# Patient Record
Sex: Female | Born: 1990 | Hispanic: No | Smoking: Current every day smoker
Health system: Southern US, Community
[De-identification: ages and names within clinical notes are randomized; demographics above are authoritative.]

## PROBLEM LIST (undated history)

## (undated) ENCOUNTER — Inpatient Hospital Stay (HOSPITAL_COMMUNITY): Payer: Self-pay

## (undated) DIAGNOSIS — J45909 Unspecified asthma, uncomplicated: Secondary | ICD-10-CM

## (undated) DIAGNOSIS — F419 Anxiety disorder, unspecified: Secondary | ICD-10-CM

## (undated) HISTORY — PX: NO PAST SURGERIES: SHX2092

---

## 2013-07-21 ENCOUNTER — Encounter (HOSPITAL_COMMUNITY): Payer: Self-pay | Admitting: Emergency Medicine

## 2013-07-21 ENCOUNTER — Emergency Department (HOSPITAL_COMMUNITY)
Admission: EM | Admit: 2013-07-21 | Discharge: 2013-07-21 | Disposition: A | Discharge status: Home / Self Care | Payer: Medicaid Other | Attending: Emergency Medicine | Admitting: Emergency Medicine

## 2013-07-21 DIAGNOSIS — N6459 Other signs and symptoms in breast: Secondary | ICD-10-CM | POA: Insufficient documentation

## 2013-07-21 DIAGNOSIS — N644 Mastodynia: Secondary | ICD-10-CM

## 2013-07-21 DIAGNOSIS — IMO0001 Reserved for inherently not codable concepts without codable children: Secondary | ICD-10-CM | POA: Insufficient documentation

## 2013-07-21 DIAGNOSIS — Z79899 Other long term (current) drug therapy: Secondary | ICD-10-CM | POA: Insufficient documentation

## 2013-07-21 DIAGNOSIS — F17200 Nicotine dependence, unspecified, uncomplicated: Secondary | ICD-10-CM | POA: Insufficient documentation

## 2013-07-21 DIAGNOSIS — Z3202 Encounter for pregnancy test, result negative: Secondary | ICD-10-CM | POA: Insufficient documentation

## 2013-07-21 LAB — POCT PREGNANCY, URINE: Preg Test, Ur: NEGATIVE

## 2013-07-21 NOTE — ED Notes (Signed)
Pt sts lump on right breast and tenderness; pt sts LMP was 8/17; pt sts could be pregnant

## 2013-07-21 NOTE — ED Provider Notes (Signed)
CSN: [id number]     Arrival date & time 07/21/13  1040 History  This chart was scribed for Antony Madura, PA, working with Toy Baker, MD by Blanchard Kelch, ED Scribe. This patient was seen in room TR08C/TR08C and the patient's care was started at 11:28 AM.    Chief Complaint  Patient presents with  . Breast Pain    The history is provided by the patient. No language interpreter was used.    HPI Comments: Leah Blair is a 22 y.o. female who presents to the Emergency Department complaining of constant pain in her b/l breasts and a lump on her right breast that began five days ago. She describes the pain as aching and worsened with touch. She denies redness and heat-to-touch around the breast lump as well as discharge from her nipples. She denies fever, diaphoresis, numbness or tingling in upper extremities, pain under arms and shortness of breath. She denies having an OB/GYN. She denies knowing any history of breast cancer in her family. Her last menstrual period was 8/17. She just got off the depo shot and states her menstrual period is currently irregular.    History reviewed. No pertinent past medical history. History reviewed. No pertinent past surgical history. History reviewed. No pertinent family history. History  Substance Use Topics  . Smoking status: Current Every Day Smoker  . Smokeless tobacco: Not on file  . Alcohol Use: Yes   OB History   Grav Para Term Preterm Abortions TAB SAB Ect Mult Living                 Review of Systems  Constitutional: Negative for fever and diaphoresis.  Respiratory: Negative for shortness of breath.   Musculoskeletal: Positive for myalgias.  Neurological: Negative for numbness.  All other systems reviewed and are negative.   Allergies  Review of patient's allergies indicates no known allergies.  Home Medications   Current Outpatient Rx  Name  Route  Sig  Dispense  Refill  . Cholecalciferol (VITAMIN D PO)   Oral   Take 1  tablet by mouth daily.         . Prenatal Vit-Fe Fumarate-FA (PRENATAL MULTIVITAMIN) TABS tablet   Oral   Take 1 tablet by mouth daily at 12 noon.         Marland Kitchen PRESCRIPTION MEDICATION   Oral   Take 1 tablet by mouth daily.          Triage Vitals: BP 120/77  Pulse 110  Temp(Src) 97.8 F (36.6 C) (Oral)  Resp 20  SpO2 100%  Physical Exam  Nursing note and vitals reviewed. Constitutional: She is oriented to person, place, and time. She appears well-developed and well-nourished. No distress.  HENT:  Head: Normocephalic and atraumatic.  Eyes: Conjunctivae and EOM are normal. No scleral icterus.  Neck: Normal range of motion.  Cardiovascular: Normal rate, regular rhythm and normal heart sounds.   Pulmonary/Chest: Effort normal. No respiratory distress. She has no wheezes. She has no rales. Right breast exhibits tenderness. Right breast exhibits no inverted nipple, no nipple discharge and no skin change. Left breast exhibits tenderness. Left breast exhibits no inverted nipple, no nipple discharge and no skin change.    Grouped, movable, tender lumps appreciated on exam of R breast at 3 o'clock position. No nodules appreciated.  Abdominal: Soft. She exhibits no distension and no mass. There is no tenderness. There is no rebound and no guarding.  Musculoskeletal: Normal range of motion.  Neurological: She  is alert and oriented to person, place, and time.  Skin: Skin is warm and dry. No rash noted. She is not diaphoretic. No erythema. No pallor.  Psychiatric: She has a normal mood and affect. Her behavior is normal.    ED Course  Procedures (including critical care time)  DIAGNOSTIC STUDIES:  Oxygen Saturation is 100% on room air, normal by my interpretation.    COORDINATION OF CARE:  11:36 AM -Recommend follow up with breast clinic and women's outpatient clinic for an OB/GYN. Patient verbalizes understanding and agrees with treatment plan.   Labs Review Labs Reviewed   POCT PREGNANCY, URINE   Imaging Review No results found.  MDM   1. Breast tenderness in female    22 year old otherwise healthy female who presents for tenderness to her bilateral breasts and lump to her right breast. Patient well and nontoxic appearing, hemodynamically stable, and afebrile. Physical exam findings as above. No physical exam findings concerning for breast abscess. No firm nodular, immobile mass appreciated. Urine pregnancy negative. Symptoms most consistent with fibrocystic breast change. Will refer to [facility] for further evaluation of symptoms. Ibuprofen recommended for pain control. Patient agreeable to plan with no unaddressed concerns.  I personally performed the services described in this documentation, which was scribed in my presence. The recorded information has been reviewed and is accurate.    Antony Madura, PA-C 07/21/13 1610  [address] PA-C 07/21/13 1610

## 2013-07-24 NOTE — ED Provider Notes (Signed)
Medical screening examination/treatment/procedure(s) were performed by non-physician practitioner and as supervising physician I was immediately available for consultation/collaboration.  Marykay Mccleod T Danell Verno, MD 07/24/13 1755 

## 2013-12-13 ENCOUNTER — Encounter (HOSPITAL_COMMUNITY): Payer: Self-pay | Admitting: Family

## 2013-12-13 ENCOUNTER — Inpatient Hospital Stay (HOSPITAL_COMMUNITY)
Admission: AD | Admit: 2013-12-13 | Discharge: 2013-12-13 | Disposition: A | Discharge status: Home / Self Care | Payer: Medicaid Other | Source: Ambulatory Visit | Attending: Obstetrics and Gynecology | Admitting: Obstetrics and Gynecology

## 2013-12-13 DIAGNOSIS — F17200 Nicotine dependence, unspecified, uncomplicated: Secondary | ICD-10-CM | POA: Insufficient documentation

## 2013-12-13 DIAGNOSIS — O039 Complete or unspecified spontaneous abortion without complication: Secondary | ICD-10-CM | POA: Insufficient documentation

## 2013-12-13 DIAGNOSIS — J45909 Unspecified asthma, uncomplicated: Secondary | ICD-10-CM | POA: Insufficient documentation

## 2013-12-13 HISTORY — DX: Unspecified asthma, uncomplicated: J45.909

## 2013-12-13 HISTORY — DX: Anxiety disorder, unspecified: F41.9

## 2013-12-13 LAB — CBC
HCT: 40.4 % (ref 36.0–46.0)
Hemoglobin: 14 g/dL (ref 12.0–15.0)
MCH: 29.6 pg (ref 26.0–34.0)
MCHC: 34.7 g/dL (ref 30.0–36.0)
MCV: 85.4 fL (ref 78.0–100.0)
Platelets: 330 10*3/uL (ref 150–400)
RBC: 4.73 MIL/uL (ref 3.87–5.11)
RDW: 14.4 % (ref 11.5–15.5)
WBC: 5.4 10*3/uL (ref 4.0–10.5)

## 2013-12-13 LAB — URINALYSIS, ROUTINE W REFLEX MICROSCOPIC
Bilirubin Urine: NEGATIVE
GLUCOSE, UA: NEGATIVE mg/dL
Ketones, ur: NEGATIVE mg/dL
LEUKOCYTES UA: NEGATIVE
Nitrite: NEGATIVE
PH: 7 (ref 5.0–8.0)
Protein, ur: NEGATIVE mg/dL
Specific Gravity, Urine: 1.01 (ref 1.005–1.030)
Urobilinogen, UA: 0.2 mg/dL (ref 0.0–1.0)

## 2013-12-13 LAB — HCG, QUANTITATIVE, PREGNANCY: hCG, Beta Chain, Quant, S: 18 m[IU]/mL — ABNORMAL HIGH (ref <5)

## 2013-12-13 LAB — URINE MICROSCOPIC-ADD ON: RBC / HPF: NONE SEEN RBC/hpf (ref <3)

## 2013-12-13 LAB — ABO/RH: ABO/RH(D): O POS

## 2013-12-13 LAB — POCT PREGNANCY, URINE: PREG TEST UR: NEGATIVE

## 2013-12-13 NOTE — MAU Provider Note (Signed)
Attestation of Attending Supervision of Advanced Practitioner (CNM/NP): Evaluation and management procedures were performed by the Advanced Practitioner under my supervision and collaboration.  I have reviewed the Advanced Practitioner's note and chart, and I agree with the management and plan.  Zoiee Wimmer 12/13/2013 3:03 PM

## 2013-12-13 NOTE — Discharge Instructions (Signed)
Miscarriage  A miscarriage is the loss of an unborn baby (fetus) before the 20th week of pregnancy. The cause is often unknown.   HOME CARE  · You may need to stay in bed (bed rest), or you may be able to do light activity. Go about activity as told by your doctor.  · Have help at home.  · Write down how many pads you use each day. Write down how soaked they are.  · Do not use tampons. Do not wash out your vagina (douche) or have sex (intercourse) until your doctor approves.  · Only take medicine as told by your doctor.  · Do not take aspirin.  · Keep all doctor visits as told.  · If you or your partner have problems with grieving, talk to your doctor. You can also try counseling. Give yourself time to grieve before trying to get pregnant again.  GET HELP RIGHT AWAY IF:  · You have bad cramps or pain in your back or belly (abdomen).  · You have a fever.  · You pass large clumps of blood (clots) from your vagina that are walnut-sized or larger. Save the clumps for your doctor to see.  · You pass large amounts of tissue from your vagina. Save the tissue for your doctor to see.  · You have more bleeding.  · You have thick, bad-smelling fluid (discharge) coming from the vagina.  · You get lightheaded, weak, or you pass out (faint).  · You have chills.  MAKE SURE YOU:  · Understand these instructions.  · Will watch your condition.  · Will get help right away if you are not doing well or get worse.  Document Released: 01/07/2012 Document Reviewed: 01/07/2012  ExitCare® Patient Information ©2014 [employer].

## 2013-12-13 NOTE — MAU Note (Signed)
Pt presents with complaints of bright red vaginal bleeding that started on February the 11th. She states that she had a + pregnancy test at her physicians office on January the 29th

## 2013-12-13 NOTE — MAU Provider Note (Signed)
History     CSN: [id number]  Arrival date and time: 12/13/13 1191   First Provider Initiated Contact with Patient 12/13/13 1000      Chief Complaint  Patient presents with  . Vaginal Bleeding   HPI 23 y.o. [id number] at Unknown with vaginal bleeding and low back pain since 12/09/13. States bleeding was light-moderate, is now subsiding, pain is improved. Patient's last menstrual period was 10/30/2013. + Pregnancy test at PCP's office on 11/26/13. States PCP has been following quants, first was 33, second was 203, third dropped to 55. Pt states they told her "they don't know what's going on". Pt states she had an u/s and "they couldn't see anything".  Past Medical History  Diagnosis Date  . Anxiety   . Asthma     History reviewed. No pertinent past surgical history.  History reviewed. No pertinent family history.  History  Substance Use Topics  . Smoking status: Current Every Day Smoker -- 0.25 packs/day  . Smokeless tobacco: Not on file  . Alcohol Use: Yes    Allergies: No Known Allergies  Prescriptions prior to admission  Medication Sig Dispense Refill  . Cholecalciferol (VITAMIN D PO) Take 1 tablet by mouth daily.      . Prenatal Vit-Fe Fumarate-FA (PRENATAL MULTIVITAMIN) TABS tablet Take 1 tablet by mouth daily at 12 noon.      Marland Kitchen PRESCRIPTION MEDICATION Take 1 tablet by mouth daily.        Review of Systems  Constitutional: Negative.   Respiratory: Negative.   Cardiovascular: Negative.   Gastrointestinal: Negative for nausea, vomiting, abdominal pain, diarrhea and constipation.  Genitourinary: Negative for dysuria, urgency, frequency, hematuria and flank pain.       + bleeding   Musculoskeletal: Positive for back pain.  Neurological: Negative.   Psychiatric/Behavioral: Negative.    Physical Exam   Blood pressure 131/78, pulse 107, temperature 98.2 F (36.8 C), resp. rate 18, height 5\' 1"  (1.549 m), weight 134 lb (60.782 kg), last menstrual period  10/30/2013.  Physical Exam  Nursing note and vitals reviewed. Constitutional: She is oriented to person, place, and time. She appears well-developed and well-nourished. No distress.  Cardiovascular: Normal rate.   Respiratory: Effort normal.  GI: Soft. There is no tenderness.  Musculoskeletal: Normal range of motion.  Neurological: She is alert and oriented to person, place, and time.  Skin: Skin is warm and dry.  Psychiatric: She has a normal mood and affect.    MAU Course  Procedures  Results for orders placed during the hospital encounter of 12/13/13 (from the past 24 hour(s))  URINALYSIS, ROUTINE W REFLEX MICROSCOPIC     Status: Abnormal   Collection Time    12/13/13  9:57 AM      Result Value Ref Range   Color, Urine YELLOW  YELLOW   APPearance CLEAR  CLEAR   Specific Gravity, Urine 1.010  1.005 - 1.030   pH 7.0  5.0 - 8.0   Glucose, UA NEGATIVE  NEGATIVE mg/dL   Hgb urine dipstick MODERATE (*) NEGATIVE   Bilirubin Urine NEGATIVE  NEGATIVE   Ketones, ur NEGATIVE  NEGATIVE mg/dL   Protein, ur NEGATIVE  NEGATIVE mg/dL   Urobilinogen, UA 0.2  0.0 - 1.0 mg/dL   Nitrite NEGATIVE  NEGATIVE   Leukocytes, UA NEGATIVE  NEGATIVE  POCT PREGNANCY, URINE     Status: None   Collection Time    12/13/13  9:57 AM      Result Value Ref Range  Preg Test, Ur NEGATIVE  NEGATIVE  URINE MICROSCOPIC-ADD ON     Status: Abnormal   Collection Time    12/13/13  9:57 AM      Result Value Ref Range   Squamous Epithelial / LPF FEW (*) RARE   RBC / HPF    <3 RBC/hpf   Value: NO FORMED ELEMENTS SEEN ON URINE MICROSCOPIC EXAMINATION   Urine-Other MUCOUS PRESENT    HCG, QUANTITATIVE, PREGNANCY     Status: Abnormal   Collection Time    12/13/13 10:10 AM      Result Value Ref Range   hCG, Beta Chain, Quant, S 18 (*) <5 mIU/mL  ABO/RH     Status: None   Collection Time    12/13/13 10:10 AM      Result Value Ref Range   ABO/RH(D) O POS    CBC     Status: None   Collection Time    12/13/13  10:10 AM      Result Value Ref Range   WBC 5.4  4.0 - 10.5 K/uL   RBC 4.73  3.87 - 5.11 MIL/uL   Hemoglobin 14.0  12.0 - 15.0 g/dL   HCT 16.140.4  09.636.0 - 04.546.0 %   MCV 85.4  78.0 - 100.0 fL   MCH 29.6  26.0 - 34.0 pg   MCHC 34.7  30.0 - 36.0 g/dL   RDW 40.914.4  81.111.5 - 91.415.5 %   Platelets 330  150 - 400 K/uL     Assessment and Plan   1. SAB (spontaneous abortion)   Declining HCG levels c/w SAB, bleeding is decreasing, hgb WNL. Pt[place], pt states she does not plan to return there for f/u. [facility], message sent to schedule appointment in 2 weeks. Bleeding and pain precautions rev'd.     Medication List         albuterol 108 (90 BASE) MCG/ACT inhaler  Commonly known as:  PROVENTIL HFA;VENTOLIN HFA  Inhale 2 puffs into the lungs every 6 (six) hours as needed for wheezing or shortness of breath.     FOLIC ACID PO  Take 1 tablet by mouth daily.     prenatal multivitamin Tabs tablet  Take 1 tablet by mouth daily at 12 noon.            Follow-up Information   [facility]. Schedule an appointment as soon as possible for a visit in 2 weeks.   Specialty:  Obstetrics and Gynecology   Contact information:   [address] [place] [id number] [phone]        [facility],Malerie Eakins 12/13/2013, 10:06 AM

## 2013-12-13 NOTE — MAU Note (Addendum)
[age] yo, LMP 10/30/13 with +HPT, presents to MAU with c/o vaginal bleeding since 12/09/13. Reports lower back pain; reports bleeding is subsiding at this time.

## 2013-12-17 ENCOUNTER — Inpatient Hospital Stay (HOSPITAL_COMMUNITY): Payer: Medicaid Other

## 2013-12-17 ENCOUNTER — Inpatient Hospital Stay (HOSPITAL_COMMUNITY)
Admission: AD | Admit: 2013-12-17 | Discharge: 2013-12-17 | Disposition: A | Discharge status: Home / Self Care | Payer: Medicaid Other | Source: Ambulatory Visit | Attending: Obstetrics & Gynecology | Admitting: Obstetrics & Gynecology

## 2013-12-17 ENCOUNTER — Encounter (HOSPITAL_COMMUNITY): Payer: Self-pay | Admitting: *Deleted

## 2013-12-17 DIAGNOSIS — O200 Threatened abortion: Secondary | ICD-10-CM

## 2013-12-17 DIAGNOSIS — F17200 Nicotine dependence, unspecified, uncomplicated: Secondary | ICD-10-CM | POA: Insufficient documentation

## 2013-12-17 DIAGNOSIS — O039 Complete or unspecified spontaneous abortion without complication: Secondary | ICD-10-CM | POA: Insufficient documentation

## 2013-12-17 LAB — CBC
HEMATOCRIT: 38.1 % (ref 36.0–46.0)
Hemoglobin: 13.6 g/dL (ref 12.0–15.0)
MCH: 30.2 pg (ref 26.0–34.0)
MCHC: 35.7 g/dL (ref 30.0–36.0)
MCV: 84.7 fL (ref 78.0–100.0)
Platelets: 291 10*3/uL (ref 150–400)
RBC: 4.5 MIL/uL (ref 3.87–5.11)
RDW: 14.3 % (ref 11.5–15.5)
WBC: 9.3 10*3/uL (ref 4.0–10.5)

## 2013-12-17 LAB — HCG, QUANTITATIVE, PREGNANCY: HCG, BETA CHAIN, QUANT, S: 4 m[IU]/mL (ref <5)

## 2013-12-17 MED ORDER — ACETAMINOPHEN 500 MG PO TABS
1000.0000 mg | ORAL_TABLET | Freq: Once | ORAL | Status: AC
  Administered 2013-12-17: 1000 mg via ORAL
  Filled 2013-12-17: qty 2

## 2013-12-17 NOTE — MAU Provider Note (Signed)
Attestation of Attending Supervision of Advanced Practitioner (PA/CNM/NP): Evaluation and management procedures were performed by the Advanced Practitioner under my supervision and collaboration.  I have reviewed the Advanced Practitioner's note and chart, and I agree with the management and plan.  Melquan Ernsberger, MD, FACOG Attending Obstetrician & Gynecologist Faculty Practice, [facility]  

## 2013-12-17 NOTE — MAU Provider Note (Signed)
History     CSN: [id number]  Arrival date and time: 12/17/13 1141   First Provider Initiated Contact with Patient 12/17/13 1153      Chief Complaint  Patient presents with  . Back Pain   HPIpt is [redacted]w[redacted]d pregnant who returns for repeat HCG after being seen on 12/13/2013 with dropping quants c/w SAB- previously evaluated By doctor in [facility]- (she does not plan to return there for care) She also had an ultrasound that didn't who anything in the uterus. Pt's HCG on 12/13/2013 was 18 and today it is 4.   RN note: Rennis Harding, RN Registered Nurse Signed  MAU Note Service date: 12/17/2013 11:45 AM   Pt Here on Sunday with dx of threatened miscarriage had some spotting an pian on .Sunday. F/U in clinic next month. Started having increased pain in her back that started last night. No bleeding just pain today     Past Medical History  Diagnosis Date  . Anxiety   . Asthma     History reviewed. No pertinent past surgical history.  No family history on file.  History  Substance Use Topics  . Smoking status: Current Every Day Smoker -- 0.25 packs/day  . Smokeless tobacco: Not on file  . Alcohol Use: Yes    Allergies: No Known Allergies  Prescriptions prior to admission  Medication Sig Dispense Refill  . albuterol (PROVENTIL HFA;VENTOLIN HFA) 108 (90 BASE) MCG/ACT inhaler Inhale 2 puffs into the lungs every 6 (six) hours as needed for wheezing or shortness of breath.      . Prenatal Vit-Fe Fumarate-FA (PRENATAL MULTIVITAMIN) TABS tablet Take 1 tablet by mouth daily at 12 noon.        Review of Systems  Constitutional: Negative for fever and chills.  Gastrointestinal: Positive for abdominal pain. Negative for nausea and vomiting.  Genitourinary: Negative for dysuria and urgency.  Musculoskeletal: Positive for back pain.  Neurological: Positive for headaches.   Physical Exam   Blood pressure 117/62, pulse 125, temperature 99.9 F (37.7 C), temperature source  Oral, resp. rate 18, last menstrual period 10/30/2013.  Physical Exam  Nursing note and vitals reviewed. Constitutional: She is oriented to person, place, and time. She appears well-developed and well-nourished. No distress.  HENT:  Head: Normocephalic.  Eyes: Pupils are equal, round, and reactive to light.  Neck: Normal range of motion. Neck supple.  Cardiovascular: Normal rate.   Respiratory: Effort normal.  GI: Soft. She exhibits no distension. There is no tenderness.  Musculoskeletal: Normal range of motion.  Neurological: She is alert and oriented to person, place, and time.  Skin: Skin is warm and dry.  Psychiatric: She has a normal mood and affect.    MAU Course  Procedures Results for orders placed during the hospital encounter of 12/17/13 (from the past 24 hour(s))  HCG, QUANTITATIVE, PREGNANCY     Status: None   Collection Time    12/17/13 12:05 PM      Result Value Ref Range   hCG, Beta Chain, Quant, S 4  <5 mIU/mL  CBC     Status: None   Collection Time    12/17/13 12:07 PM      Result Value Ref Range   WBC 9.3  4.0 - 10.5 K/uL   RBC 4.50  3.87 - 5.11 MIL/uL   Hemoglobin 13.6  12.0 - 15.0 g/dL   HCT 40.9  81.1 - 91.4 %   MCV 84.7  78.0 - 100.0 fL   MCH  30.2  26.0 - 34.0 pg   MCHC 35.7  30.0 - 36.0 g/dL   RDW 62.314.3  76.211.5 - 83.115.5 %   Platelets 291  150 - 400 K/uL   [place] Ob Comp Less 14 Wks  12/17/2013   CLINICAL DATA:  Pregnant, bleeding, 6 weeks 6 days EGA by LMP, spontaneous abortion, question retained products of conception, back pain  EXAM: OBSTETRIC <14 WK US AND TRANSVAGINAL OB US  TECHNIQUE: Both transabdominal and transvaginal ultrasound examinations were performed for complete evaluation of the gestation as well as the maternal uterus, adnexal regions, and pelvic cul-de-sac. Transvaginal technique was performed to assess early pregnancy.  COMPARISON:  None  FINDINGS: Intrauterine gestational sac: Not identified  Yolk sac:  N/A  Embryo:  N/A  Cardiac Activity:  N/A  Heart Rate:  N/A bpm  Maternal uterus/adnexae:  Mild anteflexion of the uterus.  No focal uterine mass or fluid collection.  Endometrial complex 3 mm thick without fluid or focal abnormality; no retained products of conception identified.  Left ovary normal size and morphology, 4.5 x 2.0 x 2.3 cm.  Right ovary normal size and morphology, 3.7 x 2.3 x 2.2 cm.  No adnexal masses.  Trace free pelvic fluid.  IMPRESSION: Trace free pelvic fluid.  Otherwise normal pelvic sonography.  No intrauterine pregnancy or retained products of conception identified.   Electronically Signed   By: Ulyses SouthwardMark  Boles M.D.   On: 12/17/2013 12:52   [place] Ob Transvaginal  12/17/2013   CLINICAL DATA:  Pregnant, bleeding, 6 weeks 6 days EGA by LMP, spontaneous abortion, question retained products of conception, back pain  EXAM: OBSTETRIC <14 WK US AND TRANSVAGINAL OB US  TECHNIQUE: Both transabdominal and transvaginal ultrasound examinations were performed for complete evaluation of the gestation as well as the maternal uterus, adnexal regions, and pelvic cul-de-sac. Transvaginal technique was performed to assess early pregnancy.  COMPARISON:  None  FINDINGS: Intrauterine gestational sac: Not identified  Yolk sac:  N/A  Embryo:  N/A  Cardiac Activity: N/A  Heart Rate:  N/A bpm  Maternal uterus/adnexae:  Mild anteflexion of the uterus.  No focal uterine mass or fluid collection.  Endometrial complex 3 mm thick without fluid or focal abnormality; no retained products of conception identified.  Left ovary normal size and morphology, 4.5 x 2.0 x 2.3 cm.  Right ovary normal size and morphology, 3.7 x 2.3 x 2.2 cm.  No adnexal masses.  Trace free pelvic fluid.  IMPRESSION: Trace free pelvic fluid.  Otherwise normal pelvic sonography.  No intrauterine pregnancy or retained products of conception identified.   Electronically Signed   By: Ulyses SouthwardMark  Boles M.D.   On: 12/17/2013 12:52   Assessment and Plan  SAB- f/u in clinic for post SAB  appt  Leah Blair 12/17/2013, 1:08 PM

## 2013-12-17 NOTE — MAU Note (Signed)
Pt Here on Sunday with dx of threatened miscarriage had some spotting an pian on .Sunday. F/U in clinic next month. Started having increased pain in her back that started last night. No bleeding just pain today.

## 2013-12-17 NOTE — Discharge Instructions (Signed)
Miscarriage A miscarriage is the sudden loss of an unborn baby (fetus) before the 20th week of pregnancy. Most miscarriages happen in the first 3 months of pregnancy. Sometimes, it happens before a woman even knows she is pregnant. A miscarriage is also called a "spontaneous miscarriage" or "early pregnancy loss." Having a miscarriage can be an emotional experience. Talk with your caregiver about any questions you may have about miscarrying, the grieving process, and your future pregnancy plans. CAUSES   Problems with the fetal chromosomes that make it impossible for the baby to develop normally. Problems with the baby's genes or chromosomes are most often the result of errors that occur, by chance, as the embryo divides and grows. The problems are not inherited from the parents.  Infection of the cervix or uterus.   Hormone problems.   Problems with the cervix, such as having an incompetent cervix. This is when the tissue in the cervix is not strong enough to hold the pregnancy.   Problems with the uterus, such as an abnormally shaped uterus, uterine fibroids, or congenital abnormalities.   Certain medical conditions.   Smoking, drinking alcohol, or taking illegal drugs.   Trauma.  Often, the cause of a miscarriage is unknown.  SYMPTOMS   Vaginal bleeding or spotting, with or without cramps or pain.  Pain or cramping in the abdomen or lower back.  Passing fluid, tissue, or blood clots from the vagina. DIAGNOSIS  Your caregiver will perform a physical exam. You may also have an ultrasound to confirm the miscarriage. Blood or urine tests may also be ordered. TREATMENT   Sometimes, treatment is not necessary if you naturally pass all the fetal tissue that was in the uterus. If some of the fetus or placenta remains in the body (incomplete miscarriage), tissue left behind may become infected and must be removed. Usually, a dilation and curettage (D and C) procedure is performed.  During a D and C procedure, the cervix is widened (dilated) and any remaining fetal or placental tissue is gently removed from the uterus.  Antibiotic medicines are prescribed if there is an infection. Other medicines may be given to reduce the size of the uterus (contract) if there is a lot of bleeding.  If you have Rh negative blood and your baby was Rh positive, you will need a Rh immunoglobulin shot. This shot will protect any future baby from having Rh blood problems in future pregnancies. HOME CARE INSTRUCTIONS   Your caregiver may order bed rest or may allow you to continue light activity. Resume activity as directed by your caregiver.  Have someone help with home and family responsibilities during this time.   Keep track of the number of sanitary pads you use each day and how soaked (saturated) they are. Write down this information.   Do not use tampons. Do not douche or have sexual intercourse until approved by your caregiver.   Only take over-the-counter or prescription medicines for pain or discomfort as directed by your caregiver.   Do not take aspirin. Aspirin can cause bleeding.   Keep all follow-up appointments with your caregiver.   If you or your partner have problems with grieving, talk to your caregiver or seek counseling to help cope with the pregnancy loss. Allow enough time to grieve before trying to get pregnant again.  SEEK IMMEDIATE MEDICAL CARE IF:   You have severe cramps or pain in your back or abdomen.  You have a fever.  You pass large blood clots (walnut-sized   or larger) ortissue from your vagina. Save any tissue for your caregiver to inspect.   Your bleeding increases.   You have a thick, bad-smelling vaginal discharge.  You become lightheaded, weak, or you faint.   You have chills.  MAKE SURE YOU:  Understand these instructions.  Will watch your condition.  Will get help right away if you are not doing well or get  worse. Document Released: 04/10/2001 Document Revised: 02/09/2013 Document Reviewed: 12/04/2011 ExitCare Patient Information 2014 [employer].  

## 2013-12-28 ENCOUNTER — Encounter: Payer: Medicaid Other | Admitting: Obstetrics & Gynecology

## 2013-12-29 ENCOUNTER — Encounter (HOSPITAL_COMMUNITY): Payer: Self-pay | Admitting: Emergency Medicine

## 2013-12-29 ENCOUNTER — Emergency Department (HOSPITAL_COMMUNITY): Payer: Medicaid Other

## 2013-12-29 ENCOUNTER — Emergency Department (HOSPITAL_COMMUNITY)
Admission: EM | Admit: 2013-12-29 | Discharge: 2013-12-30 | Disposition: A | Discharge status: Home / Self Care | Payer: Medicaid Other | Attending: Emergency Medicine | Admitting: Emergency Medicine

## 2013-12-29 DIAGNOSIS — N760 Acute vaginitis: Secondary | ICD-10-CM | POA: Insufficient documentation

## 2013-12-29 DIAGNOSIS — A499 Bacterial infection, unspecified: Secondary | ICD-10-CM | POA: Insufficient documentation

## 2013-12-29 DIAGNOSIS — R109 Unspecified abdominal pain: Secondary | ICD-10-CM

## 2013-12-29 DIAGNOSIS — Z79899 Other long term (current) drug therapy: Secondary | ICD-10-CM | POA: Insufficient documentation

## 2013-12-29 DIAGNOSIS — N730 Acute parametritis and pelvic cellulitis: Secondary | ICD-10-CM

## 2013-12-29 DIAGNOSIS — Z3202 Encounter for pregnancy test, result negative: Secondary | ICD-10-CM | POA: Insufficient documentation

## 2013-12-29 DIAGNOSIS — N898 Other specified noninflammatory disorders of vagina: Secondary | ICD-10-CM

## 2013-12-29 DIAGNOSIS — Z8659 Personal history of other mental and behavioral disorders: Secondary | ICD-10-CM | POA: Insufficient documentation

## 2013-12-29 DIAGNOSIS — F17200 Nicotine dependence, unspecified, uncomplicated: Secondary | ICD-10-CM | POA: Insufficient documentation

## 2013-12-29 DIAGNOSIS — B9689 Other specified bacterial agents as the cause of diseases classified elsewhere: Secondary | ICD-10-CM | POA: Insufficient documentation

## 2013-12-29 DIAGNOSIS — J45909 Unspecified asthma, uncomplicated: Secondary | ICD-10-CM | POA: Insufficient documentation

## 2013-12-29 DIAGNOSIS — N739 Female pelvic inflammatory disease, unspecified: Secondary | ICD-10-CM | POA: Insufficient documentation

## 2013-12-29 LAB — WET PREP, GENITAL
TRICH WET PREP: NONE SEEN
Yeast Wet Prep HPF POC: NONE SEEN

## 2013-12-29 LAB — CBC WITH DIFFERENTIAL/PLATELET
Basophils Absolute: 0 10*3/uL (ref 0.0–0.1)
Basophils Relative: 0 % (ref 0–1)
Eosinophils Absolute: 0.1 10*3/uL (ref 0.0–0.7)
Eosinophils Relative: 1 % (ref 0–5)
HEMATOCRIT: 41.3 % (ref 36.0–46.0)
Hemoglobin: 14.5 g/dL (ref 12.0–15.0)
LYMPHS ABS: 3.5 10*3/uL (ref 0.7–4.0)
LYMPHS PCT: 40 % (ref 12–46)
MCH: 30.6 pg (ref 26.0–34.0)
MCHC: 35.1 g/dL (ref 30.0–36.0)
MCV: 87.1 fL (ref 78.0–100.0)
MONO ABS: 0.7 10*3/uL (ref 0.1–1.0)
Monocytes Relative: 8 % (ref 3–12)
Neutro Abs: 4.3 10*3/uL (ref 1.7–7.7)
Neutrophils Relative %: 50 % (ref 43–77)
Platelets: 342 10*3/uL (ref 150–400)
RBC: 4.74 MIL/uL (ref 3.87–5.11)
RDW: 14.1 % (ref 11.5–15.5)
WBC: 8.6 10*3/uL (ref 4.0–10.5)

## 2013-12-29 LAB — URINE MICROSCOPIC-ADD ON

## 2013-12-29 LAB — URINALYSIS, ROUTINE W REFLEX MICROSCOPIC
Bilirubin Urine: NEGATIVE
Bilirubin Urine: NEGATIVE
GLUCOSE, UA: NEGATIVE mg/dL
GLUCOSE, UA: NEGATIVE mg/dL
HGB URINE DIPSTICK: NEGATIVE
HGB URINE DIPSTICK: NEGATIVE
Ketones, ur: NEGATIVE mg/dL
Ketones, ur: 15 mg/dL — AB
Leukocytes, UA: NEGATIVE
Nitrite: NEGATIVE
Nitrite: NEGATIVE
PH: 6.5 (ref 5.0–8.0)
PH: 5.5 (ref 5.0–8.0)
Protein, ur: NEGATIVE mg/dL
Protein, ur: NEGATIVE mg/dL
SPECIFIC GRAVITY, URINE: 1.026 (ref 1.005–1.030)
SPECIFIC GRAVITY, URINE: 1.025 (ref 1.005–1.030)
Urobilinogen, UA: 1 mg/dL (ref 0.0–1.0)
Urobilinogen, UA: 0.2 mg/dL (ref 0.0–1.0)

## 2013-12-29 LAB — POC URINE PREG, ED: Preg Test, Ur: NEGATIVE

## 2013-12-29 LAB — COMPREHENSIVE METABOLIC PANEL
ALT: 21 U/L (ref 0–35)
AST: 24 U/L (ref 0–37)
Albumin: 4 g/dL (ref 3.5–5.2)
Alkaline Phosphatase: 54 U/L (ref 39–117)
BUN: 8 mg/dL (ref 6–23)
CALCIUM: 9.7 mg/dL (ref 8.4–10.5)
CO2: 27 mEq/L (ref 19–32)
Chloride: 103 mEq/L (ref 96–112)
Creatinine, Ser: 0.69 mg/dL (ref 0.50–1.10)
GFR calc Af Amer: >90 mL/min (ref >90)
GFR calc non Af Amer: >90 mL/min (ref >90)
GLUCOSE: 93 mg/dL (ref 70–99)
Potassium: 4 mEq/L (ref 3.7–5.3)
SODIUM: 142 meq/L (ref 137–147)
TOTAL PROTEIN: 7.6 g/dL (ref 6.0–8.3)
Total Bilirubin: 0.4 mg/dL (ref 0.3–1.2)

## 2013-12-29 MED ORDER — DOXYCYCLINE HYCLATE 100 MG PO TABS
100.0000 mg | ORAL_TABLET | Freq: Once | ORAL | Status: AC
  Administered 2013-12-30: 100 mg via ORAL
  Filled 2013-12-29: qty 1

## 2013-12-29 MED ORDER — LIDOCAINE HCL (PF) 1 % IJ SOLN
INTRAMUSCULAR | Status: AC
  Administered 2013-12-30: 1.9 mL
  Filled 2013-12-29: qty 5

## 2013-12-29 MED ORDER — CEFTRIAXONE SODIUM 250 MG IJ SOLR
250.0000 mg | Freq: Once | INTRAMUSCULAR | Status: AC
  Administered 2013-12-30: 250 mg via INTRAMUSCULAR
  Filled 2013-12-29: qty 250

## 2013-12-29 MED ORDER — ACETAMINOPHEN 325 MG PO TABS
650.0000 mg | ORAL_TABLET | Freq: Once | ORAL | Status: AC
  Administered 2013-12-29: 650 mg via ORAL
  Filled 2013-12-29: qty 2

## 2013-12-29 MED ORDER — METRONIDAZOLE 500 MG PO TABS
500.0000 mg | ORAL_TABLET | Freq: Once | ORAL | Status: AC
  Administered 2013-12-30: 500 mg via ORAL
  Filled 2013-12-29: qty 1

## 2013-12-29 NOTE — ED Notes (Signed)
Pt sts she was told she had a miscarriage on Dec 13 2013, she was about [redacted] weeks pregnant at the time. sts afterwards she had a lot of abd cramping and bleeding. The vaginal stopped on the 15th. Pt now c/o severe abd cramping, sts "it feels like i am about to start my period" denies vaginal bleeding at this time. Denies blood in urine/dysuria. Nad, skin warm and dry, resp e/u.

## 2013-12-29 NOTE — ED Provider Notes (Signed)
CSN: [id number]     Arrival date & time 12/29/13  1732 History   First MD Initiated Contact with Patient 12/29/13 2131     Chief Complaint  Patient presents with  . Abdominal Cramping      Patient is a 23 y.o. female presenting with cramps. The history is provided by the patient.  Abdominal Cramping This is a new problem. The current episode started 1 to 4 weeks ago. The problem occurs constantly. The problem has been gradually improving. Associated symptoms include abdominal pain and nausea. Pertinent negatives include no anorexia, arthralgias, change in bowel habit, chest pain, chills, congestion, coughing, diaphoresis, fatigue, fever, headaches, joint swelling, myalgias, neck pain, numbness, rash, sore throat, urinary symptoms, vertigo, visual change, vomiting or weakness. Nothing aggravates the symptoms. She has tried acetaminophen for the symptoms. The treatment provided mild relief.    Past Medical History  Diagnosis Date  . Anxiety   . Asthma    History reviewed. No pertinent past surgical history. No family history on file. History  Substance Use Topics  . Smoking status: Current Every Day Smoker -- 0.25 packs/day  . Smokeless tobacco: Not on file  . Alcohol Use: Yes   OB History   Grav Para Term Preterm Abortions TAB SAB Ect Mult Living   5 1 1  3  3   1      Review of Systems  Constitutional: Negative for fever, chills, diaphoresis, activity change, appetite change and fatigue.  HENT: Negative for congestion, ear pain, rhinorrhea and sore throat.   Eyes: Negative for pain.  Respiratory: Negative for cough and shortness of breath.   Cardiovascular: Negative for chest pain and palpitations.  Gastrointestinal: Positive for nausea and abdominal pain. Negative for vomiting, anorexia and change in bowel habit.  Genitourinary: Positive for vaginal discharge. Negative for dysuria, vaginal bleeding, difficulty urinating, vaginal pain and pelvic pain.  Musculoskeletal: Negative  for arthralgias, back pain, joint swelling, myalgias and neck pain.  Skin: Negative for rash and wound.  Neurological: Negative for vertigo, weakness, numbness and headaches.  Psychiatric/Behavioral: Negative for behavioral problems, confusion and agitation.      Allergies  Review of patient's allergies indicates no known allergies.  Home Medications   Current Outpatient Rx  Name  Route  Sig  Dispense  Refill  . acetaminophen (TYLENOL) 500 MG tablet   Oral   Take 500 mg by mouth every 6 (six) hours as needed for moderate pain.         Marland Kitchen albuterol (PROVENTIL HFA;VENTOLIN HFA) 108 (90 BASE) MCG/ACT inhaler   Inhalation   Inhale 2 puffs into the lungs every 6 (six) hours as needed for wheezing or shortness of breath.         . Prenatal Vit-Fe Fumarate-FA (PRENATAL MULTIVITAMIN) TABS tablet   Oral   Take 1 tablet by mouth daily at 12 noon.          BP 104/60  Pulse 76  Temp(Src) 99.4 F (37.4 C) (Oral)  Resp 17  Wt 134 lb (60.782 kg)  SpO2 98%  LMP 10/30/2013 Physical Exam  Constitutional: She is oriented to person, place, and time. She appears well-developed and well-nourished. No distress.  HENT:  Head: Normocephalic and atraumatic.  Nose: Nose normal.  Mouth/Throat: Oropharynx is clear and moist.  Eyes: EOM are normal. Pupils are equal, round, and reactive to light.  Neck: Normal range of motion. Neck supple. No tracheal deviation present.  Cardiovascular: Normal rate, regular rhythm, normal heart sounds and intact  distal pulses.   Pulmonary/Chest: Effort normal and breath sounds normal. She has no rales.  Abdominal: Soft. Bowel sounds are normal. She exhibits no distension. There is no tenderness. There is no rebound and no guarding.  Genitourinary: Vaginal discharge found.  Moderate amount of foul smelling discharge. No cervical motion ttp but uterus is tender with movement. No adnexal fullness noted.   Musculoskeletal: Normal range of motion. She exhibits no  tenderness.  Neurological: She is alert and oriented to person, place, and time.  Skin: Skin is warm and dry. No rash noted.  Psychiatric: She has a normal mood and affect. Her behavior is normal.    ED Course  Procedures (including critical care time) Labs Review Results for orders placed during the hospital encounter of 12/29/13  WET PREP, GENITAL      Result Value Ref Range   Yeast Wet Prep HPF POC NONE SEEN  NONE SEEN   Trich, Wet Prep NONE SEEN  NONE SEEN   Clue Cells Wet Prep HPF POC FEW (*) NONE SEEN   WBC, Wet Prep HPF POC MANY (*) NONE SEEN  COMPREHENSIVE METABOLIC PANEL      Result Value Ref Range   Sodium 142  137 - 147 mEq/L   Potassium 4.0  3.7 - 5.3 mEq/L   Chloride 103  96 - 112 mEq/L   CO2 27  19 - 32 mEq/L   Glucose, Bld 93  70 - 99 mg/dL   BUN 8  6 - 23 mg/dL   Creatinine, Ser 6.96  0.50 - 1.10 mg/dL   Calcium 9.7  8.4 - 29.5 mg/dL   Total Protein 7.6  6.0 - 8.3 g/dL   Albumin 4.0  3.5 - 5.2 g/dL   AST 24  0 - 37 U/L   ALT 21  0 - 35 U/L   Alkaline Phosphatase 54  39 - 117 U/L   Total Bilirubin 0.4  0.3 - 1.2 mg/dL   GFR calc non Af Amer >90  >90 mL/min   GFR calc Af Amer >90  >90 mL/min  CBC WITH DIFFERENTIAL      Result Value Ref Range   WBC 8.6  4.0 - 10.5 K/uL   RBC 4.74  3.87 - 5.11 MIL/uL   Hemoglobin 14.5  12.0 - 15.0 g/dL   HCT 28.4  13.2 - 44.0 %   MCV 87.1  78.0 - 100.0 fL   MCH 30.6  26.0 - 34.0 pg   MCHC 35.1  30.0 - 36.0 g/dL   RDW 10.2  72.5 - 36.6 %   Platelets 342  150 - 400 K/uL   Neutrophils Relative % 50  43 - 77 %   Neutro Abs 4.3  1.7 - 7.7 K/uL   Lymphocytes Relative 40  12 - 46 %   Lymphs Abs 3.5  0.7 - 4.0 K/uL   Monocytes Relative 8  3 - 12 %   Monocytes Absolute 0.7  0.1 - 1.0 K/uL   Eosinophils Relative 1  0 - 5 %   Eosinophils Absolute 0.1  0.0 - 0.7 K/uL   Basophils Relative 0  0 - 1 %   Basophils Absolute 0.0  0.0 - 0.1 K/uL  URINALYSIS, ROUTINE W REFLEX MICROSCOPIC      Result Value Ref Range   Color, Urine  YELLOW  YELLOW   APPearance CLOUDY (*) CLEAR   Specific Gravity, Urine 1.026  1.005 - 1.030   pH 6.5  5.0 - 8.0  Glucose, UA NEGATIVE  NEGATIVE mg/dL   Hgb urine dipstick NEGATIVE  NEGATIVE   Bilirubin Urine NEGATIVE  NEGATIVE   Ketones, ur NEGATIVE  NEGATIVE mg/dL   Protein, ur NEGATIVE  NEGATIVE mg/dL   Urobilinogen, UA 1.0  0.0 - 1.0 mg/dL   Nitrite NEGATIVE  NEGATIVE   Leukocytes, UA SMALL (*) NEGATIVE  URINE MICROSCOPIC-ADD ON      Result Value Ref Range   Squamous Epithelial / LPF MANY (*) RARE   WBC, UA 21-50  <3 WBC/hpf   Bacteria, UA FEW (*) RARE  URINALYSIS, ROUTINE W REFLEX MICROSCOPIC      Result Value Ref Range   Color, Urine YELLOW  YELLOW   APPearance CLEAR  CLEAR   Specific Gravity, Urine 1.025  1.005 - 1.030   pH 5.5  5.0 - 8.0   Glucose, UA NEGATIVE  NEGATIVE mg/dL   Hgb urine dipstick NEGATIVE  NEGATIVE   Bilirubin Urine NEGATIVE  NEGATIVE   Ketones, ur 15 (*) NEGATIVE mg/dL   Protein, ur NEGATIVE  NEGATIVE mg/dL   Urobilinogen, UA 0.2  0.0 - 1.0 mg/dL   Nitrite NEGATIVE  NEGATIVE   Leukocytes, UA NEGATIVE  NEGATIVE  POC URINE PREG, ED      Result Value Ref Range   Preg Test, Ur NEGATIVE  NEGATIVE    Imaging Review [place] Transvaginal Non-ob  12/29/2013   CLINICAL DATA:  Recent miscarriage with vaginal discharge and pelvic pain. Concern for retained products of conception.  EXAM: TRANSVAGINAL ULTRASOUND OF PELVIS  TECHNIQUE: Transvaginal ultrasound examination of the pelvis was performed including evaluation of the uterus, ovaries, adnexal regions, and pelvic cul-de-sac.  COMPARISON:  None.  FINDINGS: Technologist worksheet not available.  Uterus  Measurements: 9 x 4 x 5 cm. No fibroids or other mass visualized.  Endometrium  Thickness: 13 mm.  No focal abnormality visualized.  Right ovary  Measurements: 3.1 x 3.3 x 3.1 cm. Normal appearance/no adnexal mass.  Left ovary  Measurements: 2.5 x 2.2 x 3.7 cm. Normal appearance/no adnexal mass.  Other findings:  Small  volume, simple appearing free fluid.  IMPRESSION: 1. Proliferating endometrium, now 13 mm. Given the endometrium was thin and homogeneous 12/17/2013, doubt retained products of conception. 2. Normal ovaries. 3. Small volume free pelvic fluid.   Electronically Signed   By: Tiburcio PeaJonathan  Watts M.D.   On: 12/29/2013 23:59      MDM   Final diagnoses:  Vaginal discharge  Abdominal cramping  BV (bacterial vaginosis)  PID (acute pelvic inflammatory disease)    23 yo F in NAD AFVSS non toxic appearing who presents with 2 weeks of worsening vaginal discharge and lower abd cramping. Recently (2 weeks ago) had a miscarriage. U preg today negative. UA with no UTI. Pelvic exam with copious amounts of malodorous discharge and TTP of uterus. No adnexal fullness or TTP. Will obtain US pelvis to r/o retained products of conception. Will treat as PID with rocephin and doxycycline. Case co managed with my attending Dr. Loretha StaplerWofford.   12:10 AM US with no retained products. Normal ovaries. Suspect patient will be menstruating soon. Will send home with RX for metronidazole and doxycycline x 14 days.   Thorough discussion with patient on plan , findings, return precautions. Strong return precautions given. Follow up with PCP in 2-3 days or sooner in ED if concners or worsening.      Nadara MustardJulio Tukker Byrns, MD 12/30/13 [phone]

## 2013-12-30 LAB — GC/CHLAMYDIA PROBE AMP
CT Probe RNA: NEGATIVE
GC Probe RNA: NEGATIVE

## 2013-12-30 MED ORDER — DOXYCYCLINE HYCLATE 100 MG PO TABS: 100.0000 mg | ORAL_TABLET | Freq: Two times a day (BID) | ORAL | Status: DC

## 2013-12-30 MED ORDER — METRONIDAZOLE 500 MG PO TABS: 500.0000 mg | ORAL_TABLET | Freq: Three times a day (TID) | ORAL | Status: DC

## 2013-12-30 MED ORDER — METRONIDAZOLE 500 MG PO TABS: 500.0000 mg | ORAL_TABLET | Freq: Two times a day (BID) | ORAL | Status: DC

## 2013-12-30 NOTE — Discharge Instructions (Signed)
Bacterial Vaginosis  Bacterial vaginosis is a vaginal infection that occurs when the normal balance of bacteria in the vagina is disrupted. It results from an overgrowth of certain bacteria. This is the most common vaginal infection in women of childbearing age. Treatment is important to prevent complications, especially in pregnant women, as it can cause a premature delivery.  CAUSES   Bacterial vaginosis is caused by an increase in harmful bacteria that are normally present in smaller amounts in the vagina. Several different kinds of bacteria can cause bacterial vaginosis. However, the reason that the condition develops is not fully understood.  RISK FACTORS  Certain activities or behaviors can put you at an increased risk of developing bacterial vaginosis, including:   Having a new sex partner or multiple sex partners.   Douching.   Using an intrauterine device (IUD) for contraception.  Women do not get bacterial vaginosis from toilet seats, bedding, swimming pools, or contact with objects around them.  SIGNS AND SYMPTOMS   Some women with bacterial vaginosis have no signs or symptoms. Common symptoms include:   Grey vaginal discharge.   A fishlike odor with discharge, especially after sexual intercourse.   Itching or burning of the vagina and vulva.   Burning or pain with urination.  DIAGNOSIS   Your health care provider will take a medical history and examine the vagina for signs of bacterial vaginosis. A sample of vaginal fluid may be taken. Your health care provider will look at this sample under a microscope to check for bacteria and abnormal cells. A vaginal pH test may also be done.   TREATMENT   Bacterial vaginosis may be treated with antibiotic medicines. These may be given in the form of a pill or a vaginal cream. A second round of antibiotics may be prescribed if the condition comes back after treatment.   HOME CARE INSTRUCTIONS    Only take over-the-counter or prescription medicines as  directed by your health care provider.   If antibiotic medicine was prescribed, take it as directed. Make sure you finish it even if you start to feel better.   Do not have sex until treatment is completed.   Tell all sexual partners that you have a vaginal infection. They should see their health care provider and be treated if they have problems, such as a mild rash or itching.   Practice safe sex by using condoms and only having one sex partner.  SEEK MEDICAL CARE IF:    Your symptoms are not improving after 3 days of treatment.   You have increased discharge or pain.   You have a fever.  MAKE SURE YOU:    Understand these instructions.   Will watch your condition.   Will get help right away if you are not doing well or get worse.  FOR MORE INFORMATION   Centers for Disease Control and Prevention, Division of STD Prevention: www.cdc.gov/std  American Sexual Health Association (ASHA): www.ashastd.org   Document Released: 10/15/2005 Document Revised: 08/05/2013 Document Reviewed: 05/27/2013  ExitCare Patient Information 2014 [employer]  Pelvic Inflammatory Disease  Pelvic inflammatory disease (PID) is an infection in some or all of the female organs. PID can be in the uterus, ovaries, fallopian tubes, or the surrounding tissues inside the lower belly area (pelvis).  HOME CARE    If given, take your antibiotic medicine as told. Finish them even if you start to feel better.   Only take medicine as told by your doctor.   Do not   have sex (intercourse) until treatment is done or as told by your doctor.   Tell your sex partner if you have PID. Your partner may need to be treated.   Keep all doctor visits.  GET HELP RIGHT AWAY IF:    You have a fever.   You have more belly (abdominal) or lower belly pain.   You have chills.   You have pain when you pee (urinate).   You are not better after 72 hours.   You have more fluid (discharge) coming from your vagina or fluid that is not normal.   You need  pain medicine from your doctor.   You throw up (vomit).   You cannot take your medicines.   Your partner has a sexually transmitted disease (STD).  MAKE SURE YOU:    Understand these instructions.   Will watch your condition.   Will get help right away if you are not doing well or get worse.  Document Released: 01/11/2009 Document Revised: 02/09/2013 Document Reviewed: 10/11/2011  ExitCare Patient Information 2014 [employer].

## 2013-12-31 NOTE — ED Provider Notes (Signed)
I saw and evaluated the patient, reviewed the resident's note and I agree with the findings and plan.  23 yo female w recent miscarriage presenting with lower abdominal pain.  On exam, not toxic, well appearing, sitting upright in bed.  Normal respiratory effort, normal perfusion.  Abdomen mildly TTP in suprapubic region, no r/r/g.  US negative for retained products.  Treated for PID.  Advised Gyn followup.  Clinical Impression: 1. Vaginal discharge   2. Abdominal cramping   3. BV (bacterial vaginosis)   4. PID (acute pelvic inflammatory disease)       Candyce ChurnJohn David Paymon Rosensteel III, MD 12/31/13 1007

## 2014-08-30 ENCOUNTER — Encounter (HOSPITAL_COMMUNITY): Payer: Self-pay | Admitting: Emergency Medicine

## 2014-10-18 ENCOUNTER — Encounter (HOSPITAL_COMMUNITY): Payer: Self-pay | Admitting: *Deleted

## 2015-01-22 IMAGING — US US OB TRANSVAGINAL
1 series · 14 of 28 positions shown · non-contrast
Comparison: None

CLINICAL DATA: Pregnant, bleeding, 6 weeks 6 days EGA by LMP,
spontaneous abortion, question retained products of conception, back
pain

EXAM:
OBSTETRIC <14 WK US AND TRANSVAGINAL OB US
TECHNIQUE: Both transabdominal and transvaginal ultrasound examinations were
performed for complete evaluation of the gestation as well as the
maternal uterus, adnexal regions, and pelvic cul-de-sac.
Transvaginal technique was performed to assess early pregnancy.

[Series 1: us ob comp less 14 wks · 48 acquisitions, 14 frames shown]
[im 2/48]
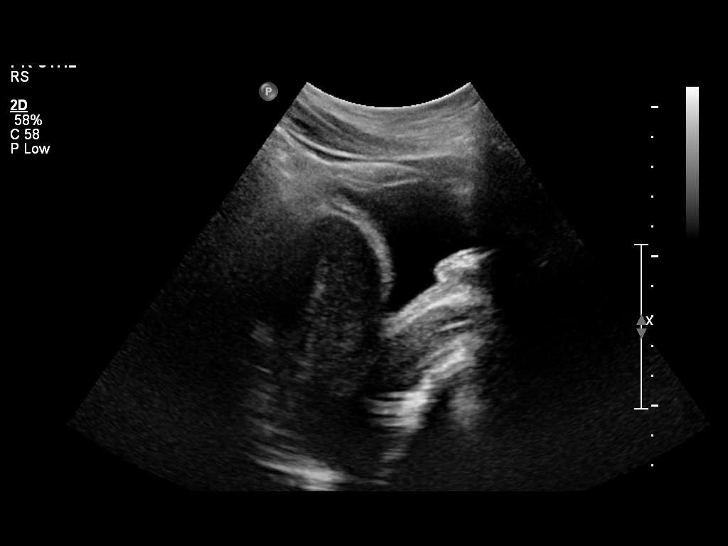
[im 6/48]
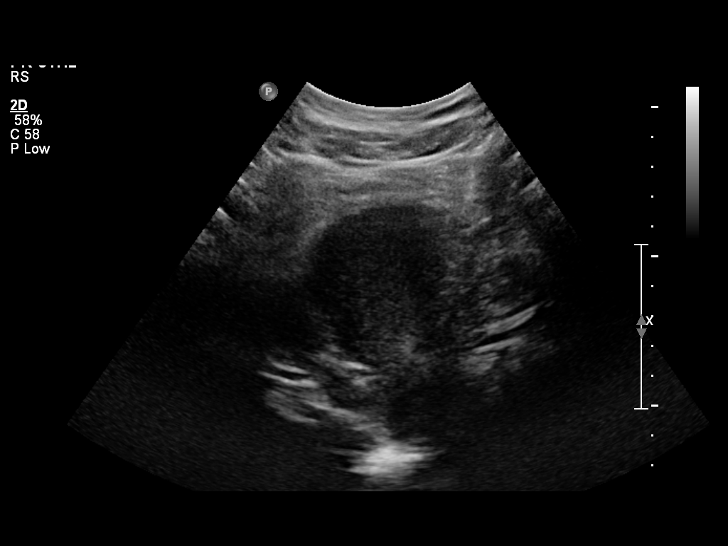
[im 9/48]
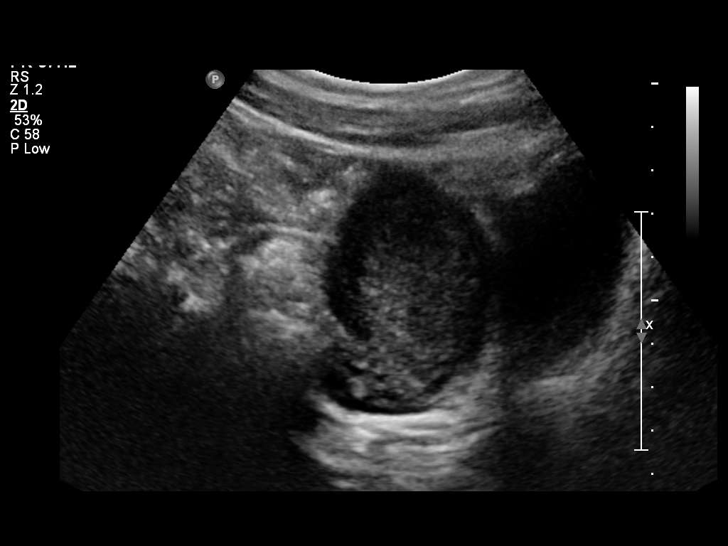
[im 13/48]
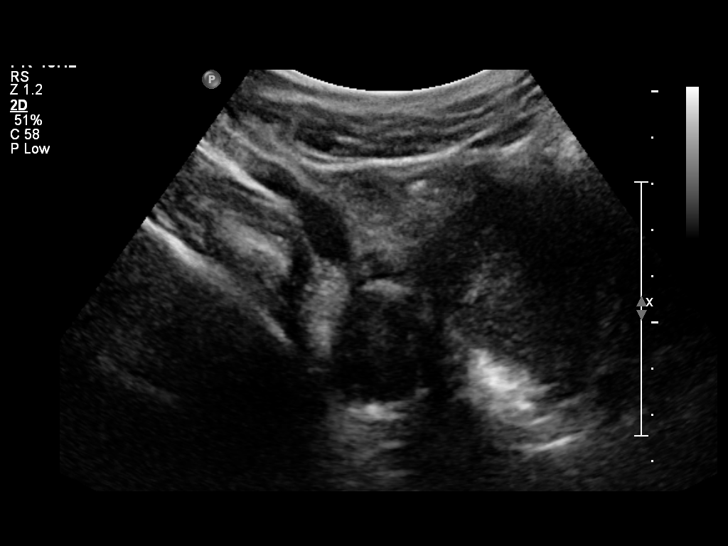
[im 16/48]
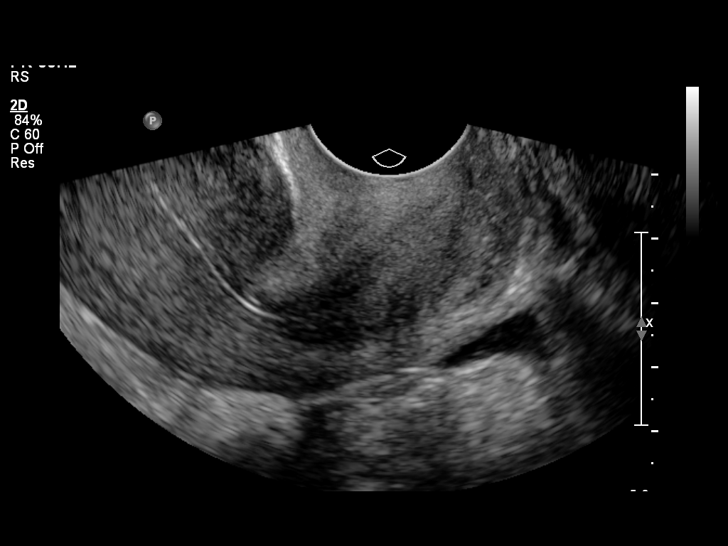
[im 20/48]
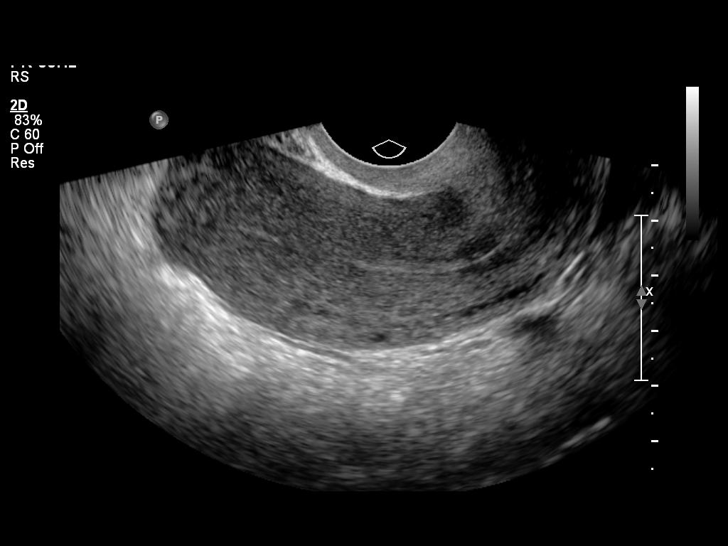
[im 23/48]
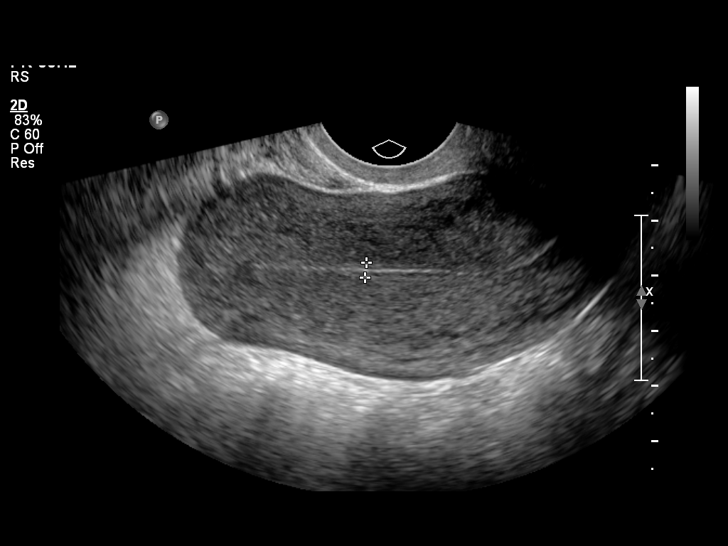
[im 27/48]
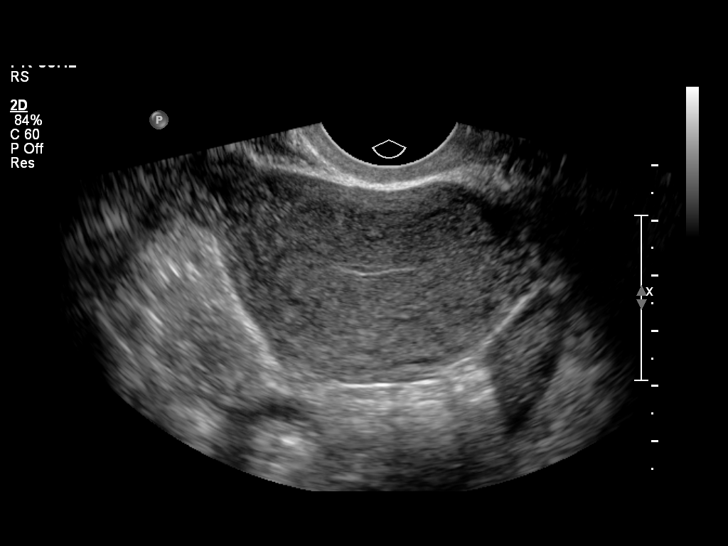
[im 30/48]
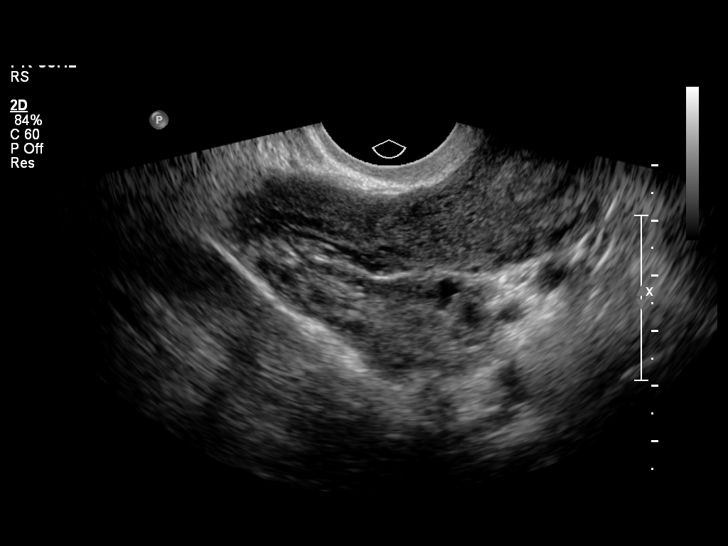
[im 34/48]
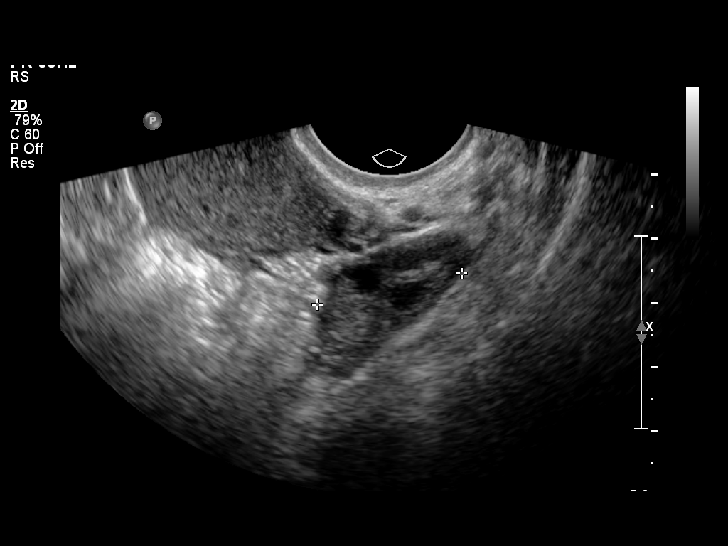
[im 37/48]
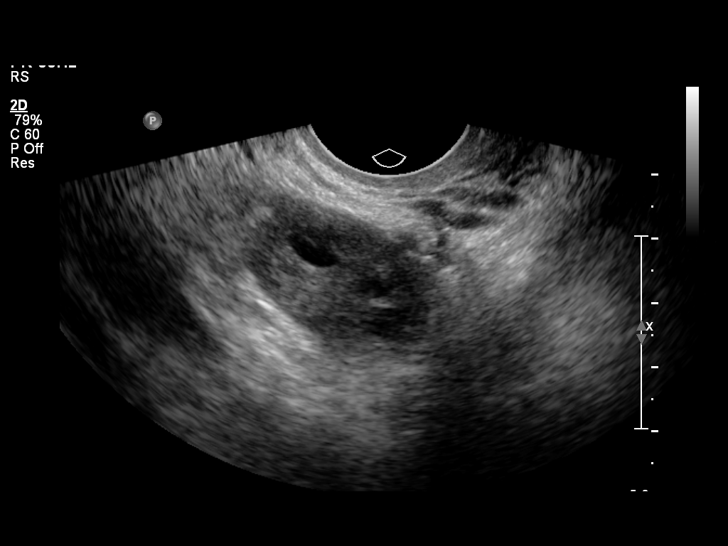
[im 41/48]
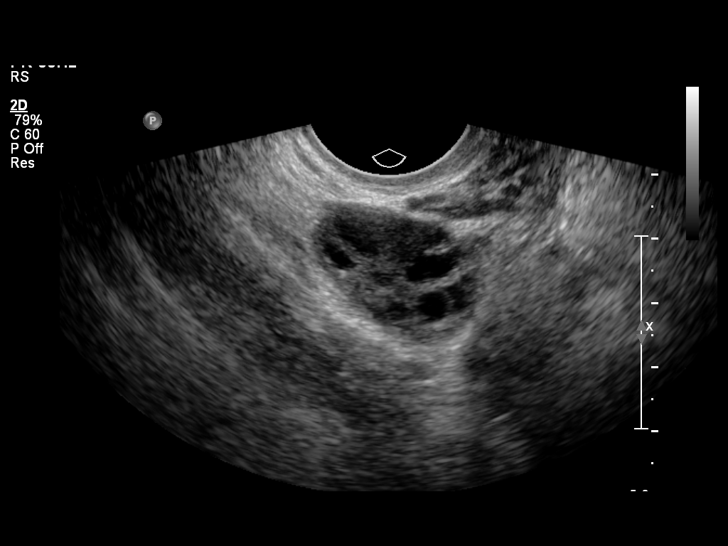
[im 44/48]
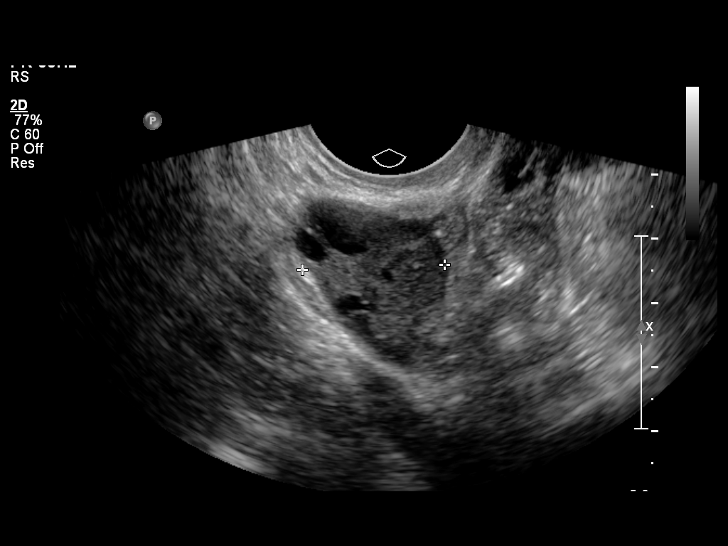
[im 48/48]
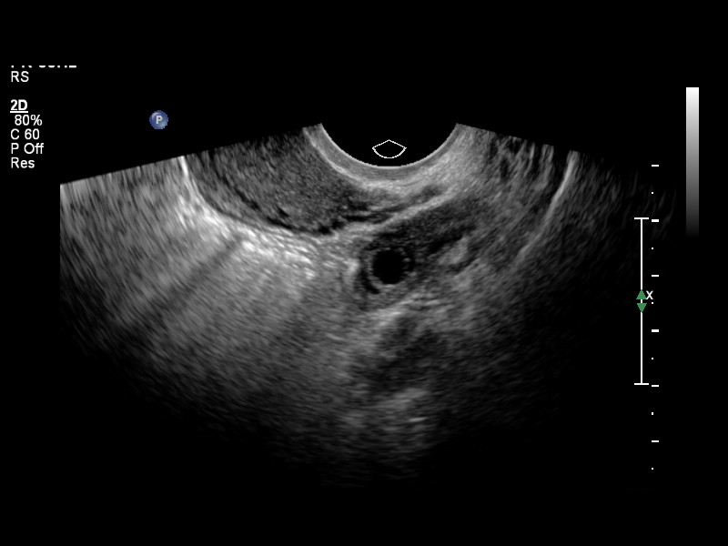

[14 of 28 positions shown; findings below may reference images not displayed]

FINDINGS: Intrauterine gestational sac: Not identified

Yolk sac:  N/A

Embryo:  N/A

Cardiac Activity: N/A

Heart Rate:  N/A bpm

Maternal uterus/adnexae:

Mild anteflexion of the uterus.

No focal uterine mass or fluid collection.

Endometrial complex 3 mm thick without fluid or focal abnormality;
no retained products of conception identified.

Left ovary normal size and morphology, 4.5 x 2.0 x 2.3 cm.

Right ovary normal size and morphology, 3.7 x 2.3 x 2.2 cm.

No adnexal masses.

Trace free pelvic fluid.
IMPRESSION: Trace free pelvic fluid.

Otherwise normal pelvic sonography.

No intrauterine pregnancy or retained products of conception
identified.

## 2015-02-03 IMAGING — US US TRANSVAGINAL NON-OB
1 series · 14 of 25 positions shown · non-contrast
Comparison: None.

CLINICAL DATA: Recent miscarriage with vaginal discharge and pelvic
pain. Concern for retained products of conception.

EXAM:
TRANSVAGINAL ULTRASOUND OF PELVIS
TECHNIQUE: Transvaginal ultrasound examination of the pelvis was performed
including evaluation of the uterus, ovaries, adnexal regions, and
pelvic cul-de-sac.

[Series 1: us transvaginal non-ob · 0.11mm/px · 14 of 43 slices shown]
[im 1/43]
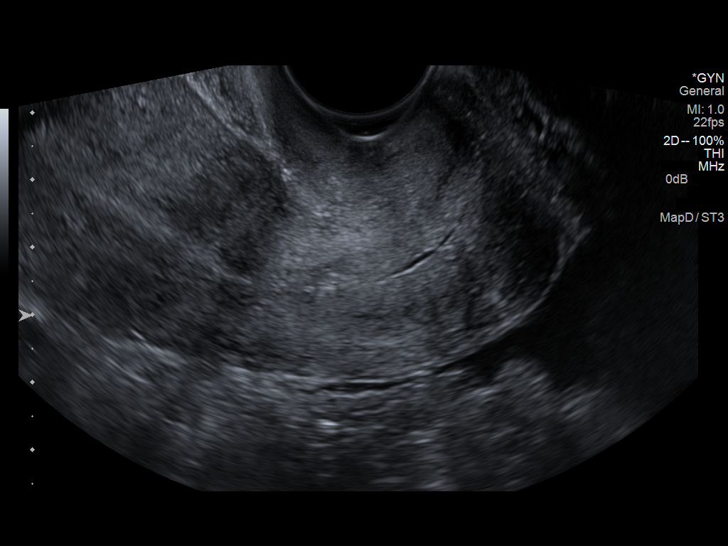
[im 4/43]
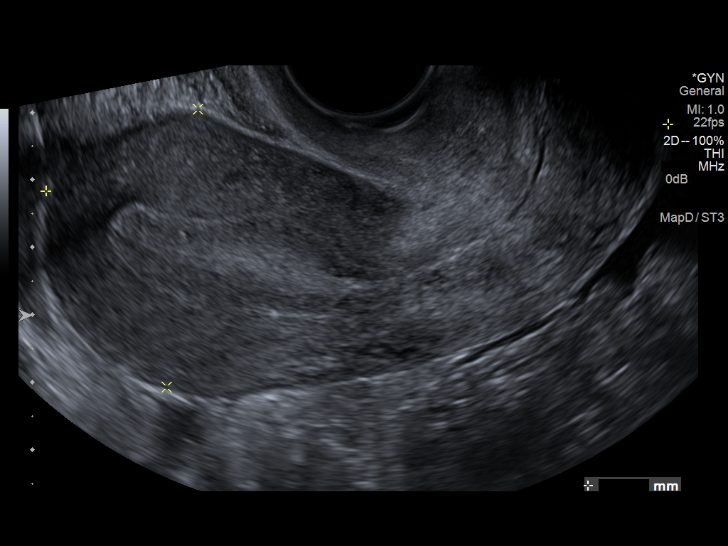
[im 8/43]
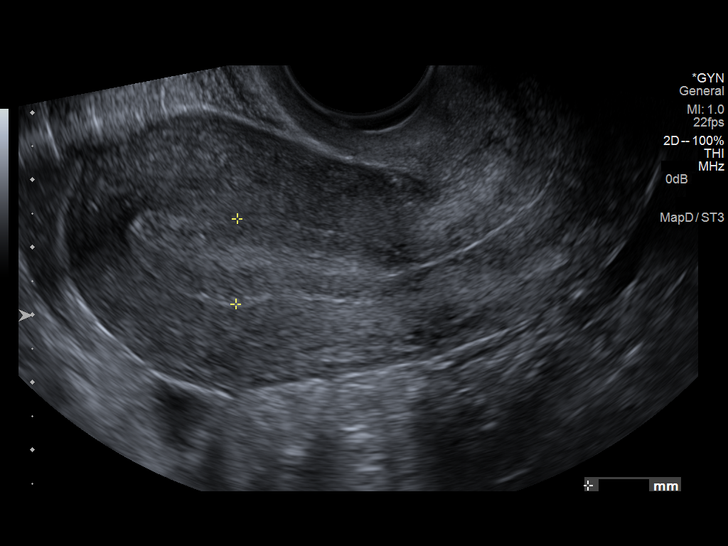
[im 11/43]
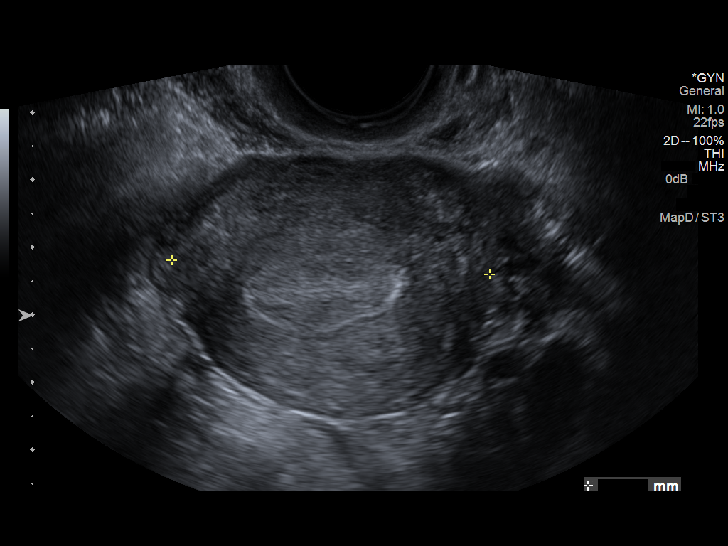
[im 15/43]
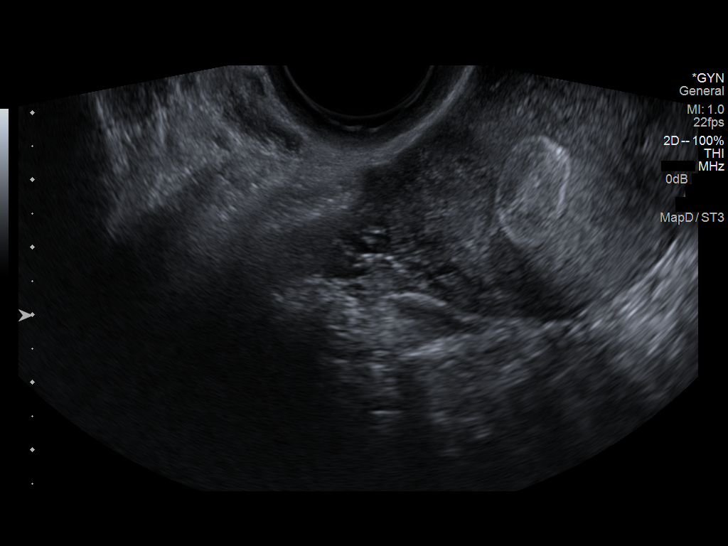
[im 16/43]
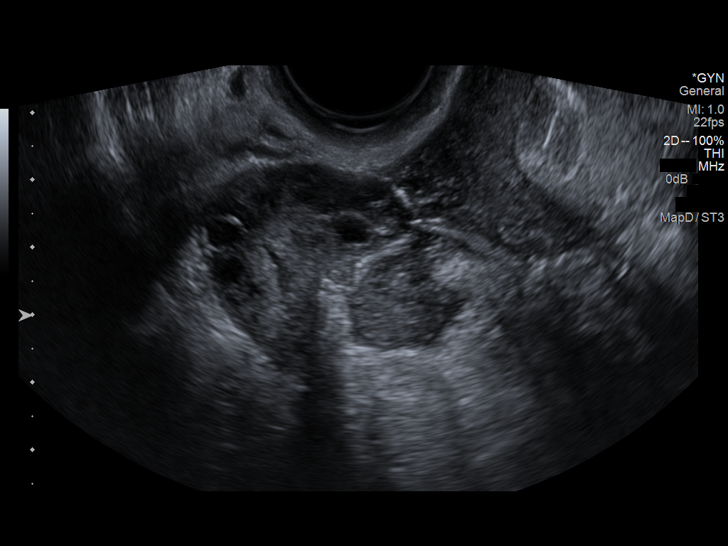
[im 20/43]
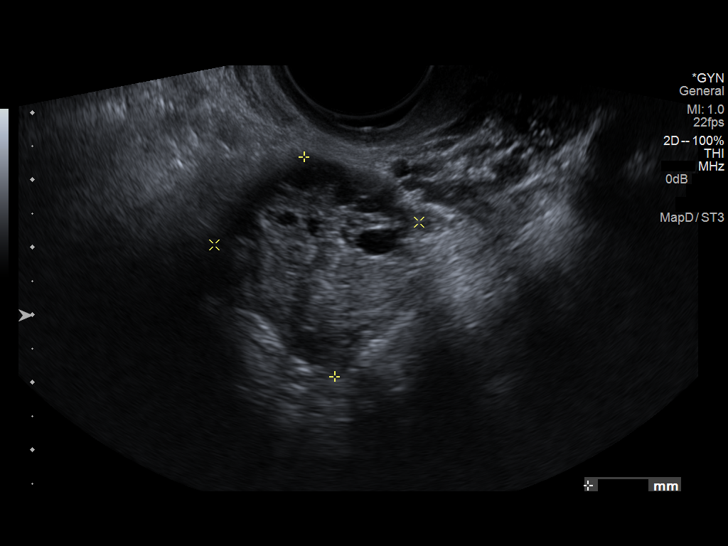
[im 23/43]
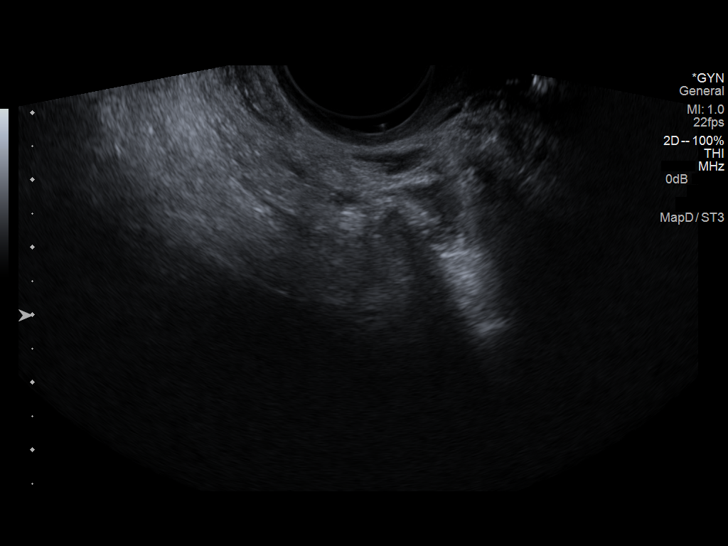
[im 27/43]
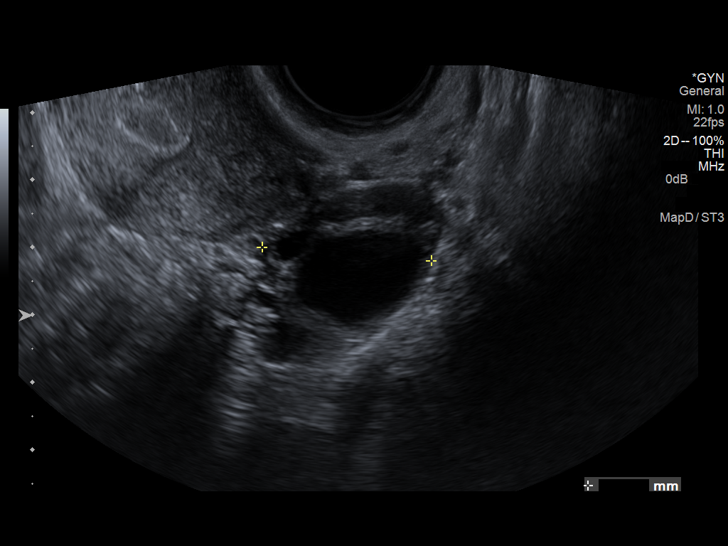
[im 29/43]
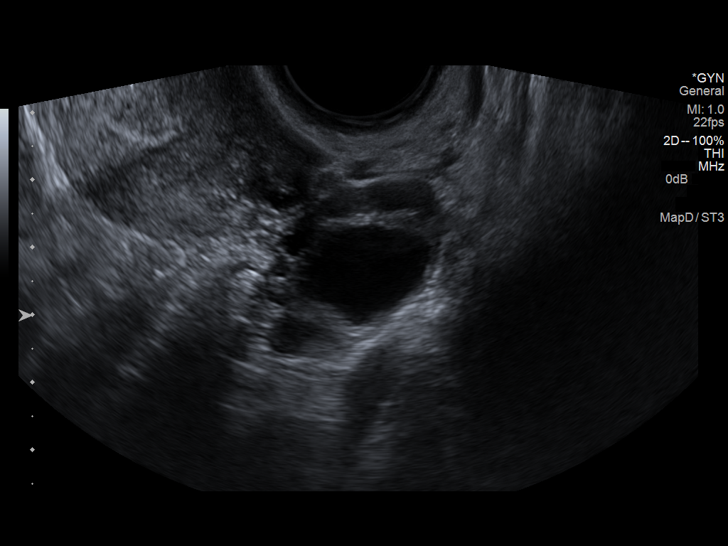
[im 32/43]
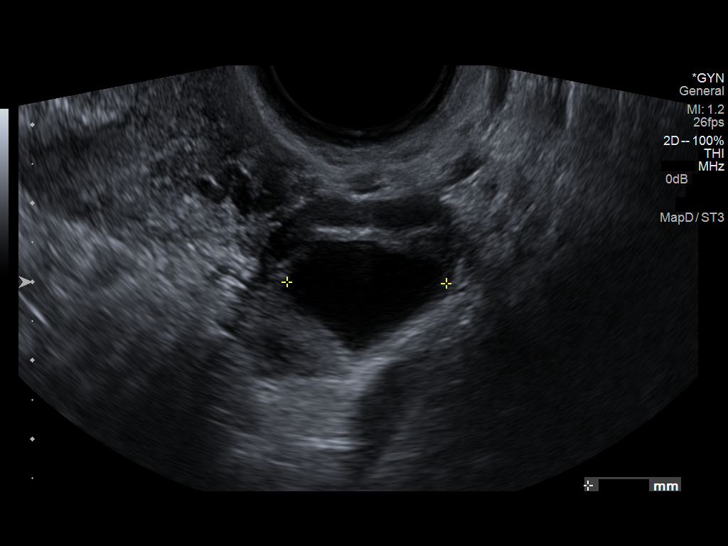
[im 36/43]
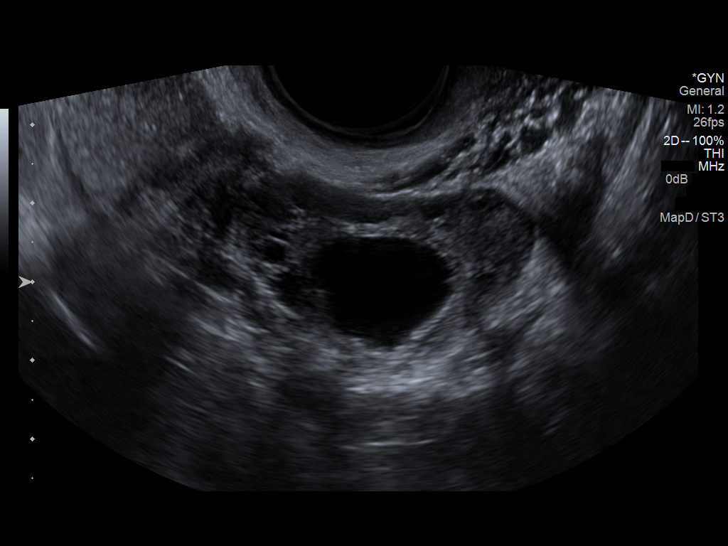
[im 39/43]
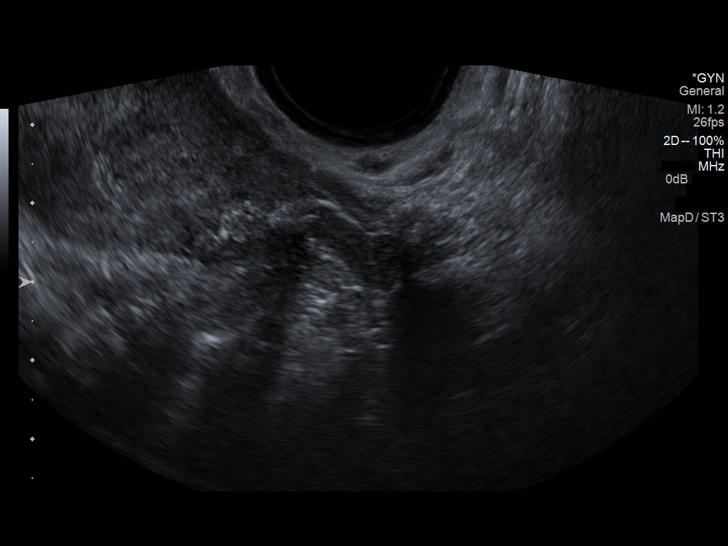
[im 43/43]
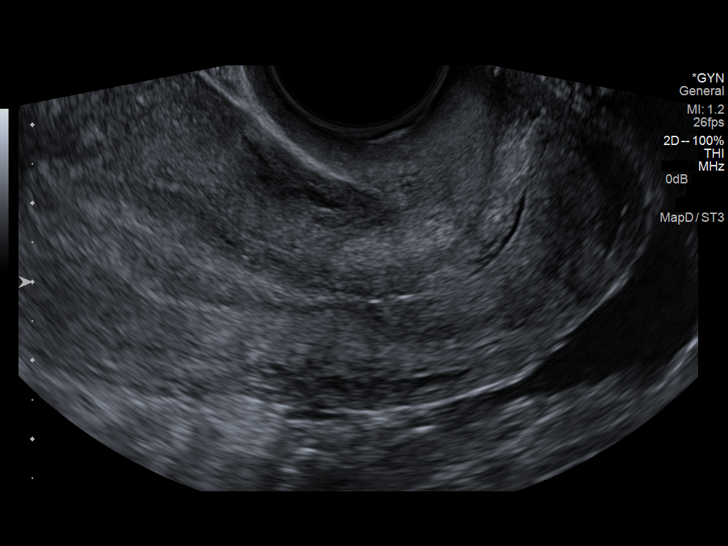

[14 of 25 positions shown; findings below may reference images not displayed]

FINDINGS: Technologist worksheet not available.

Uterus

Measurements: 9 x 4 x 5 cm. No fibroids or other mass visualized.

Endometrium

Thickness: 13 mm.  No focal abnormality visualized.

Right ovary

Measurements: 3.1 x 3.3 x 3.1 cm. Normal appearance/no adnexal mass.

Left ovary

Measurements: 2.5 x 2.2 x 3.7 cm. Normal appearance/no adnexal mass.

Other findings:  Small volume, simple appearing free fluid.
IMPRESSION: 1. Proliferating endometrium, now 13 mm. Given the endometrium was
thin and homogeneous 12/17/2013, doubt retained products of
conception.
2. Normal ovaries.
3. Small volume free pelvic fluid.

## 2016-02-14 ENCOUNTER — Inpatient Hospital Stay (HOSPITAL_COMMUNITY)
Admission: AD | Admit: 2016-02-14 | Discharge: 2016-02-15 | Disposition: A | Discharge status: Home / Self Care | Payer: Medicaid Other | Attending: Family Medicine | Admitting: Family Medicine

## 2016-02-14 ENCOUNTER — Encounter (HOSPITAL_COMMUNITY): Payer: Self-pay | Admitting: *Deleted

## 2016-02-14 DIAGNOSIS — O99333 Smoking (tobacco) complicating pregnancy, third trimester: Secondary | ICD-10-CM | POA: Diagnosis not present

## 2016-02-14 DIAGNOSIS — Z3A28 28 weeks gestation of pregnancy: Secondary | ICD-10-CM | POA: Insufficient documentation

## 2016-02-14 DIAGNOSIS — O2343 Unspecified infection of urinary tract in pregnancy, third trimester: Secondary | ICD-10-CM | POA: Diagnosis not present

## 2016-02-14 DIAGNOSIS — R109 Unspecified abdominal pain: Secondary | ICD-10-CM | POA: Diagnosis present

## 2016-02-14 LAB — URINALYSIS, ROUTINE W REFLEX MICROSCOPIC
Bilirubin Urine: NEGATIVE
GLUCOSE, UA: NEGATIVE mg/dL
Hgb urine dipstick: NEGATIVE
Ketones, ur: 15 mg/dL — AB
Nitrite: NEGATIVE
PROTEIN: NEGATIVE mg/dL
Specific Gravity, Urine: 1.02 (ref 1.005–1.030)
pH: 6.5 (ref 5.0–8.0)

## 2016-02-14 LAB — URINE MICROSCOPIC-ADD ON: RBC / HPF: NONE SEEN RBC/hpf (ref 0–5)

## 2016-02-14 MED ORDER — NITROFURANTOIN MONOHYD MACRO 100 MG PO CAPS: 100.0000 mg | ORAL_CAPSULE | Freq: Two times a day (BID) | ORAL | Status: AC

## 2016-02-14 NOTE — MAU Note (Addendum)
PT SAYS  SHE JUST MUST MOVED HERE FROM  [place]   [facility]  [facility]-   WITH   [facility]     X1  VISIT      WOULD  HAVE  DEL AT [facility].       IN NOV  SHE WENT  TO SEE FAMILY DR-  [place]   - TOLD UTI       NO DR  HERE.     HAD UTI IN JAN/ FEB-    GAVE MEDS - TOOK ALL.         NOW  HAS  SAME  S/S.      HURTS  TO VOID , NO BURNING-      X2 WEEKS.   .      FEELS  BABY MOVE.      PAIN IS  MOSTLY ON RIGHT  SIDE.

## 2016-02-14 NOTE — MAU Provider Note (Signed)
History     CSN: [id number]  Arrival date and time: 02/14/16 2138   None     Chief Complaint  Patient presents with  . Abdominal Pain   HPI Leah Blair, a 25 yo [id number] at [redacted]w[redacted]d, presents with 2 weeks of right sided pain. Patient reports that the pain lasts all day without relief with tylenol, and has increased in severity over the period. She reports that it "stings" with urination, but has not noted any changes in the color of the urine or any vaginal discharge or bleeding. She reports being treated for a UTI earlier in this gestation, which she says felt similar to her current symptoms. She denies any contractions or a gush of fluid, and reports active fetal movements.   OB History    Gravida Para Term Preterm AB TAB SAB Ectopic Multiple Living   Past Medical History  Diagnosis Date  . Anxiety   . Asthma     Past Surgical History  Procedure Laterality Date  . No past surgeries      History reviewed. No pertinent family history.  Social History  Substance Use Topics  . Smoking status: Current Every Day Smoker -- 0.25 packs/day  . Smokeless tobacco: Never Used  . Alcohol Use: Yes    Allergies: No Known Allergies  Prescriptions prior to admission  Medication Sig Dispense Refill Last Dose  . acetaminophen (TYLENOL) 500 MG tablet Take 500 mg by mouth every 6 (six) hours as needed for moderate pain.   12/29/2013 at Unknown time  . albuterol (PROVENTIL HFA;VENTOLIN HFA) 108 (90 BASE) MCG/ACT inhaler Inhale 2 puffs into the lungs every 6 (six) hours as needed for wheezing or shortness of breath.   Past Month at Unknown time  . doxycycline (VIBRA-TABS) 100 MG tablet Take 1 tablet (100 mg total) by mouth 2 (two) times daily. 28 tablet 0   . Prenatal Vit-Fe Fumarate-FA (PRENATAL MULTIVITAMIN) TABS tablet Take 1 tablet by mouth daily at 12 noon.   12/29/2013 at Unknown time    Review of Systems  Constitutional: Negative for fever and chills.   Cardiovascular: Negative for chest pain.  Gastrointestinal: Positive for heartburn. Negative for nausea and vomiting.  Genitourinary: Positive for dysuria, urgency, frequency and flank pain. Negative for hematuria.  Neurological: Positive for headaches.     Physical Exam   Blood pressure 111/60, pulse 101, temperature 98.2 F (36.8 C), temperature source Oral, height  (1.549 m), weight 154 lb 6 oz (70.024 kg), unknown if currently breastfeeding.  Physical Exam  Constitutional: She is oriented to person, place, and time. She appears well-developed and well-nourished.  Cardiovascular: Normal rate, regular rhythm, normal heart sounds and intact distal pulses.  Exam reveals no gallop and no friction rub.   No murmur heard. Respiratory: Effort normal and breath sounds normal. No respiratory distress. She has no wheezes. She has no rales. She exhibits no tenderness.  GI: Soft. Bowel sounds are normal. There is no tenderness. There is no rebound and no guarding.  Neurological: She is alert and oriented to person, place, and time.  GU: No CVA tenderness.   MAU Course  Procedures  MDM Given the similarity of the symptoms to her prior urinary tract infection during this gestation, UTI is at the top of the differential. However, her U/A found trace leukocytes and few bacteria along with few squamous epithelial cells.  Pyelonephritis is less likely given  the lack of CVA tenderness, fever, and WBC casts on U/A. Other worrisome causes of right sided abdominal pain such as appendicitis are also less likely given the lack of systemic symptoms and reproducible pain with physical exam.    Results for orders placed or performed during the hospital encounter of 02/14/16 (from the past 48 hour(s))  Urinalysis, Routine w reflex microscopic ([facility])     Status: Abnormal   Collection Time: 02/14/16 10:00 PM  Result Value Ref Range   Color, Urine YELLOW YELLOW   APPearance CLEAR CLEAR   Specific  Gravity, Urine 1.020 1.005 - 1.030   pH 6.5 5.0 - 8.0   Glucose, UA NEGATIVE NEGATIVE mg/dL   Hgb urine dipstick NEGATIVE NEGATIVE   Bilirubin Urine NEGATIVE NEGATIVE   Ketones, ur 15 (A) NEGATIVE mg/dL   Protein, ur NEGATIVE NEGATIVE mg/dL   Nitrite NEGATIVE NEGATIVE   Leukocytes, UA TRACE (A) NEGATIVE  Urine microscopic-add on     Status: Abnormal   Collection Time: 02/14/16 10:00 PM  Result Value Ref Range   Squamous Epithelial / LPF 0-5 (A) NONE SEEN   WBC, UA 0-5 0 - 5 WBC/hpf   RBC / HPF NONE SEEN 0 - 5 RBC/hpf   Bacteria, UA FEW (A) NONE SEEN   Urine-Other MUCOUS PRESENT     Assessment and Plan  Nadeen LandauCarinea Smedley, a 25 yo [id number] at 5972w2d, presents with 2 weeks of right sided pain and dysuria. We plan to investigate further using a urine culture to determine sensitivities, but will begin treatment for a UTI given the reported similarity of the symptoms to her prior UTI. She will be discharged after obtaining her contact information if needed to inform about the results of the urine culture; she will receive instructions to return if she notes fever, back pain, nausea or vomiting.  Jinny BlossomKaty D Mayo (seen with Malka Sohomas Daubert, MS) 02/14/2016, 11:12 PM   OB fellow attestation: I have seen and examined this patient; I agree with above documentation in the resident's note.   Amil AmenCarinea D Slee is a 25 y.o. [phone] reporting lower abdominal pain that feels like prior UTI +FM, denies LOF, VB, contractions, vaginal discharge.  PE: BP 108/52 mmHg  Pulse 93  Temp(Src) 97.7 F (36.5 C) (Oral)  Resp 18  Ht 5\' 1"  (1.549 m)  Wt 154 lb 6 oz (70.024 kg)  BMI 29.18 kg/m2 Gen: calm comfortable, NAD Resp: normal effort, no distress Abd: gravid. Soft, NT ND.   ROS, labs, PMH reviewed NST reactive   Plan: - fetal kick counts reinforced, preterm labor precautions - Treat with macrobid, await culture. - Given information for [facility] to establish care.   Federico FlakeKimberly Niles Newton, MD , MPH,  [facility] Family Medicine, OB Fellow [facility] [place] [facility]  Attending physician: Tinnie Gensanya Pratt MD

## 2016-02-14 NOTE — Discharge Instructions (Signed)
You are diagnosed with a urinary tract infection. We have prescribed you an antibiotic called Macrobid which you will take two times a day for 7 days. We will contact you after receiving the results of the urine culture if we decide to change your antibiotic.    Return to the hospital if you notice any fevers, back pain, nausea or vomiting.

## 2016-02-15 DIAGNOSIS — O2343 Unspecified infection of urinary tract in pregnancy, third trimester: Secondary | ICD-10-CM

## 2016-02-16 LAB — CULTURE, OB URINE: Culture: 1000 — AB

## 2016-12-19 ENCOUNTER — Encounter (HOSPITAL_COMMUNITY): Payer: Self-pay
